# Patient Record
Sex: Male | Born: 1975 | Race: White | Hispanic: No | Smoking: Former smoker
Health system: Southern US, Community
[De-identification: ages and names within clinical notes are randomized; demographics above are authoritative.]

## PROBLEM LIST (undated history)

## (undated) DIAGNOSIS — S0285XA Fracture of orbit, unspecified, initial encounter for closed fracture: Secondary | ICD-10-CM

## (undated) HISTORY — PX: FACIAL FRACTURE SURGERY: SHX1570

---

## 1999-04-08 ENCOUNTER — Emergency Department (HOSPITAL_COMMUNITY): Admission: EM | Admit: 1999-04-08 | Discharge: 1999-04-08 | Payer: Self-pay | Admitting: Emergency Medicine

## 1999-04-11 ENCOUNTER — Emergency Department (HOSPITAL_COMMUNITY): Admission: EM | Admit: 1999-04-11 | Discharge: 1999-04-11 | Payer: Self-pay | Admitting: Emergency Medicine

## 2000-10-02 ENCOUNTER — Emergency Department (HOSPITAL_COMMUNITY): Admission: EM | Admit: 2000-10-02 | Discharge: 2000-10-02 | Payer: Self-pay | Admitting: Emergency Medicine

## 2001-12-22 ENCOUNTER — Encounter: Payer: Self-pay | Admitting: Emergency Medicine

## 2001-12-22 ENCOUNTER — Emergency Department (HOSPITAL_COMMUNITY): Admission: EM | Admit: 2001-12-22 | Discharge: 2001-12-22 | Payer: Self-pay | Admitting: Emergency Medicine

## 2004-06-21 ENCOUNTER — Emergency Department (HOSPITAL_COMMUNITY): Admission: EM | Admit: 2004-06-21 | Discharge: 2004-06-21 | Payer: Self-pay | Admitting: Emergency Medicine

## 2004-10-08 ENCOUNTER — Emergency Department (HOSPITAL_COMMUNITY): Admission: EM | Admit: 2004-10-08 | Discharge: 2004-10-08 | Payer: Self-pay | Admitting: Emergency Medicine

## 2004-10-09 ENCOUNTER — Emergency Department (HOSPITAL_COMMUNITY): Admission: EM | Admit: 2004-10-09 | Discharge: 2004-10-09 | Payer: Self-pay | Admitting: Emergency Medicine

## 2005-01-04 ENCOUNTER — Emergency Department (HOSPITAL_COMMUNITY): Admission: EM | Admit: 2005-01-04 | Discharge: 2005-01-04 | Payer: Self-pay | Admitting: Emergency Medicine

## 2005-01-06 ENCOUNTER — Emergency Department (HOSPITAL_COMMUNITY): Admission: EM | Admit: 2005-01-06 | Discharge: 2005-01-06 | Payer: Self-pay | Admitting: *Deleted

## 2005-01-09 ENCOUNTER — Emergency Department (HOSPITAL_COMMUNITY): Admission: EM | Admit: 2005-01-09 | Discharge: 2005-01-09 | Payer: Self-pay | Admitting: Family Medicine

## 2005-06-15 ENCOUNTER — Emergency Department (HOSPITAL_COMMUNITY): Admission: EM | Admit: 2005-06-15 | Discharge: 2005-06-15 | Payer: Self-pay | Admitting: Emergency Medicine

## 2005-06-21 ENCOUNTER — Emergency Department (HOSPITAL_COMMUNITY): Admission: EM | Admit: 2005-06-21 | Discharge: 2005-06-21 | Payer: Self-pay | Admitting: Emergency Medicine

## 2005-07-08 IMAGING — CR DG THORACIC SPINE 2V
3 series · 3 of 3 positions shown · non-contrast
Comparison: none

CLINICAL DATA: Fell last night with pain. 
 THORACIC SPINE - 3 VIEW:
 Three views of the thoracic spine were obtained.  The thoracic vertebrae are in normal alignment.  No acute compression deformity is seen.

[view not recorded (1 of 3)]
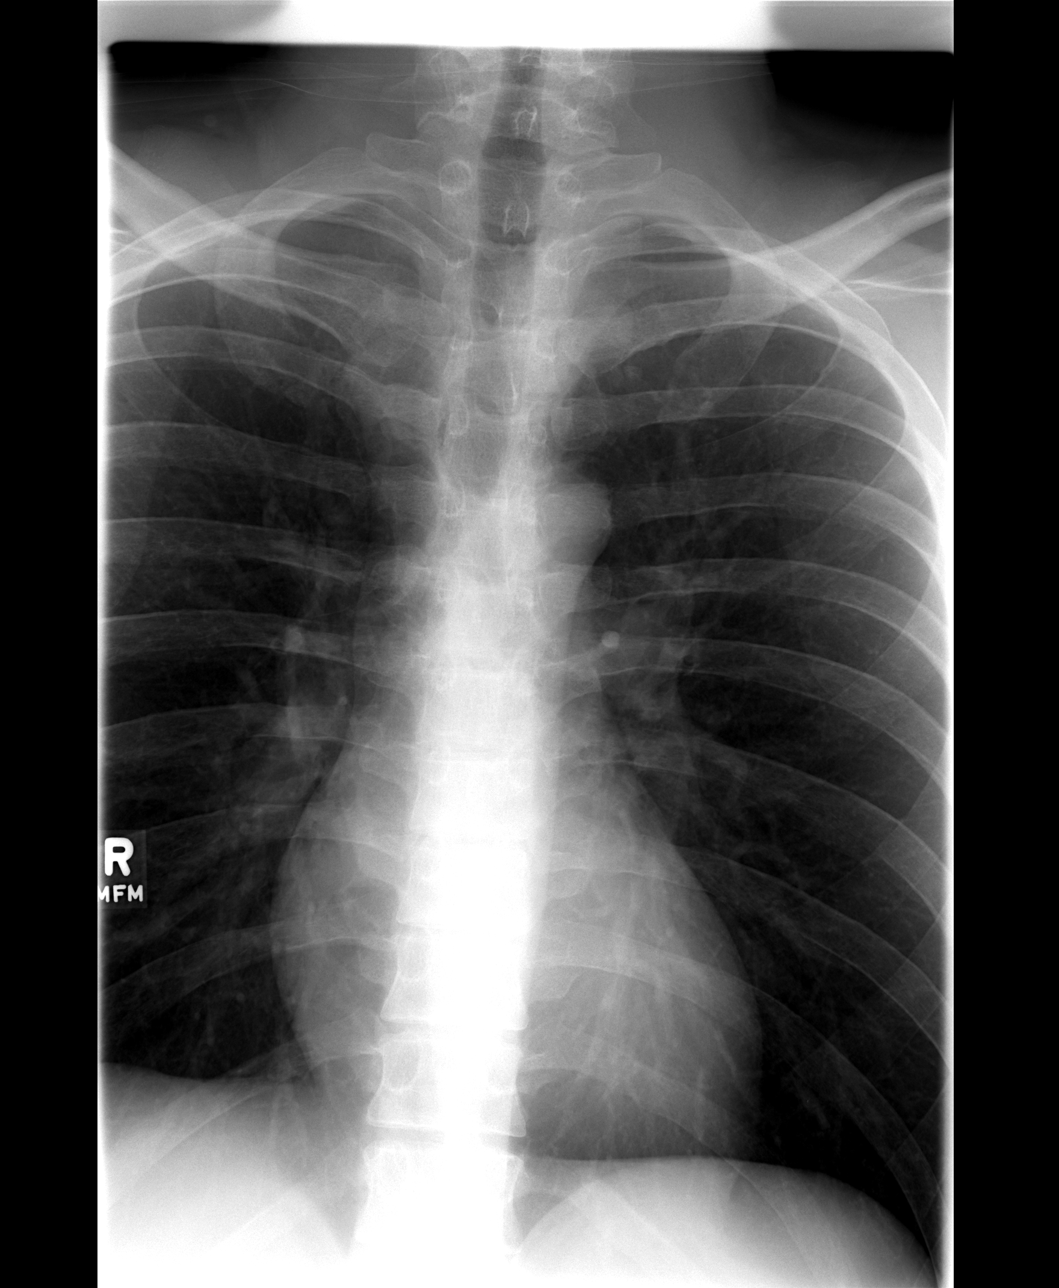

[view not recorded (2 of 3)]
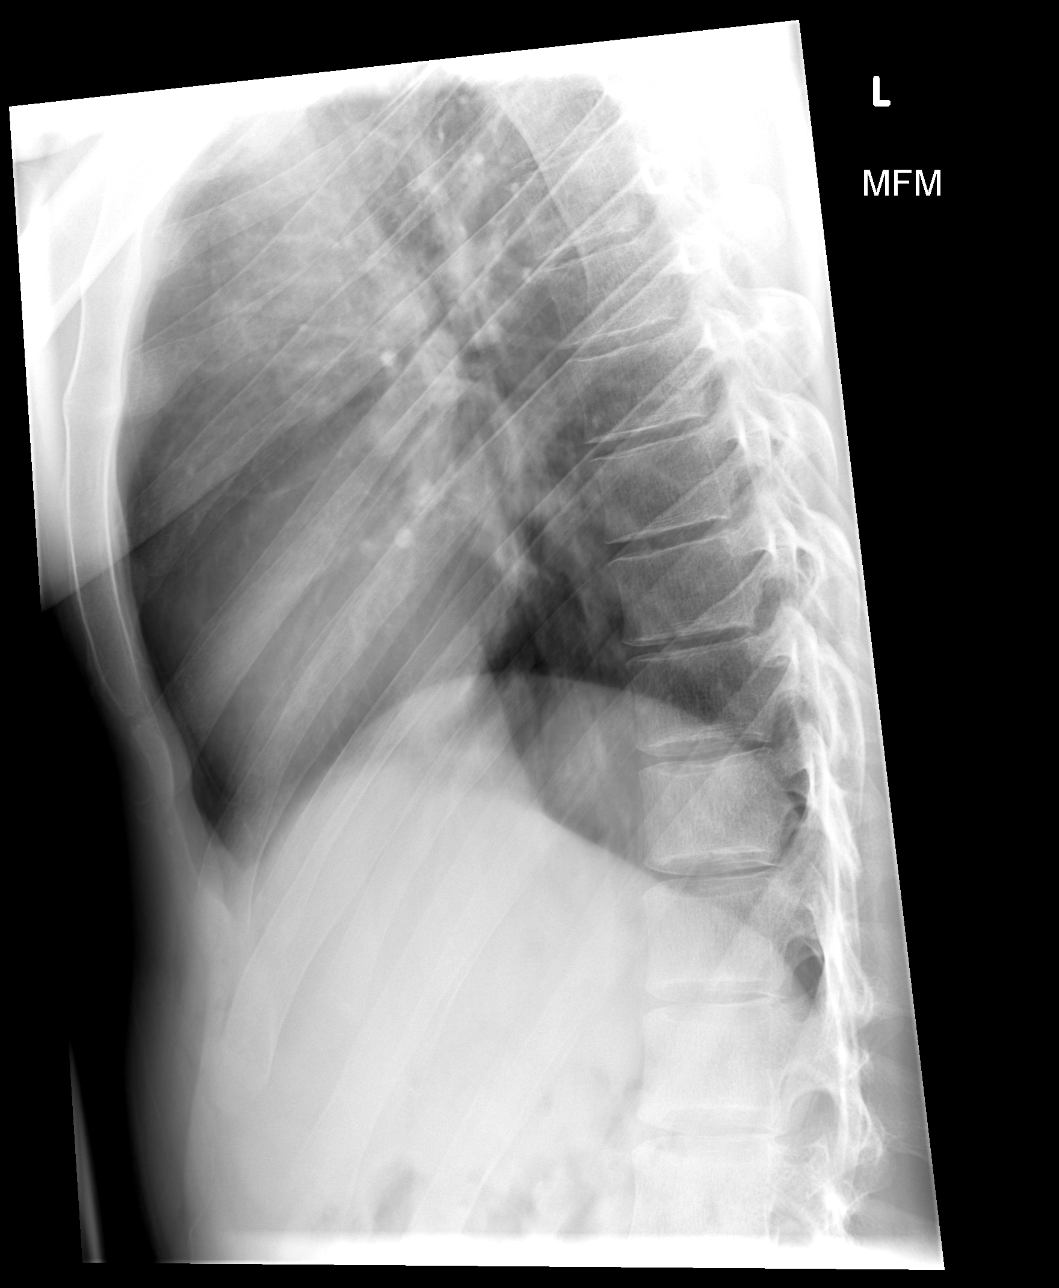

[view not recorded (3 of 3)]
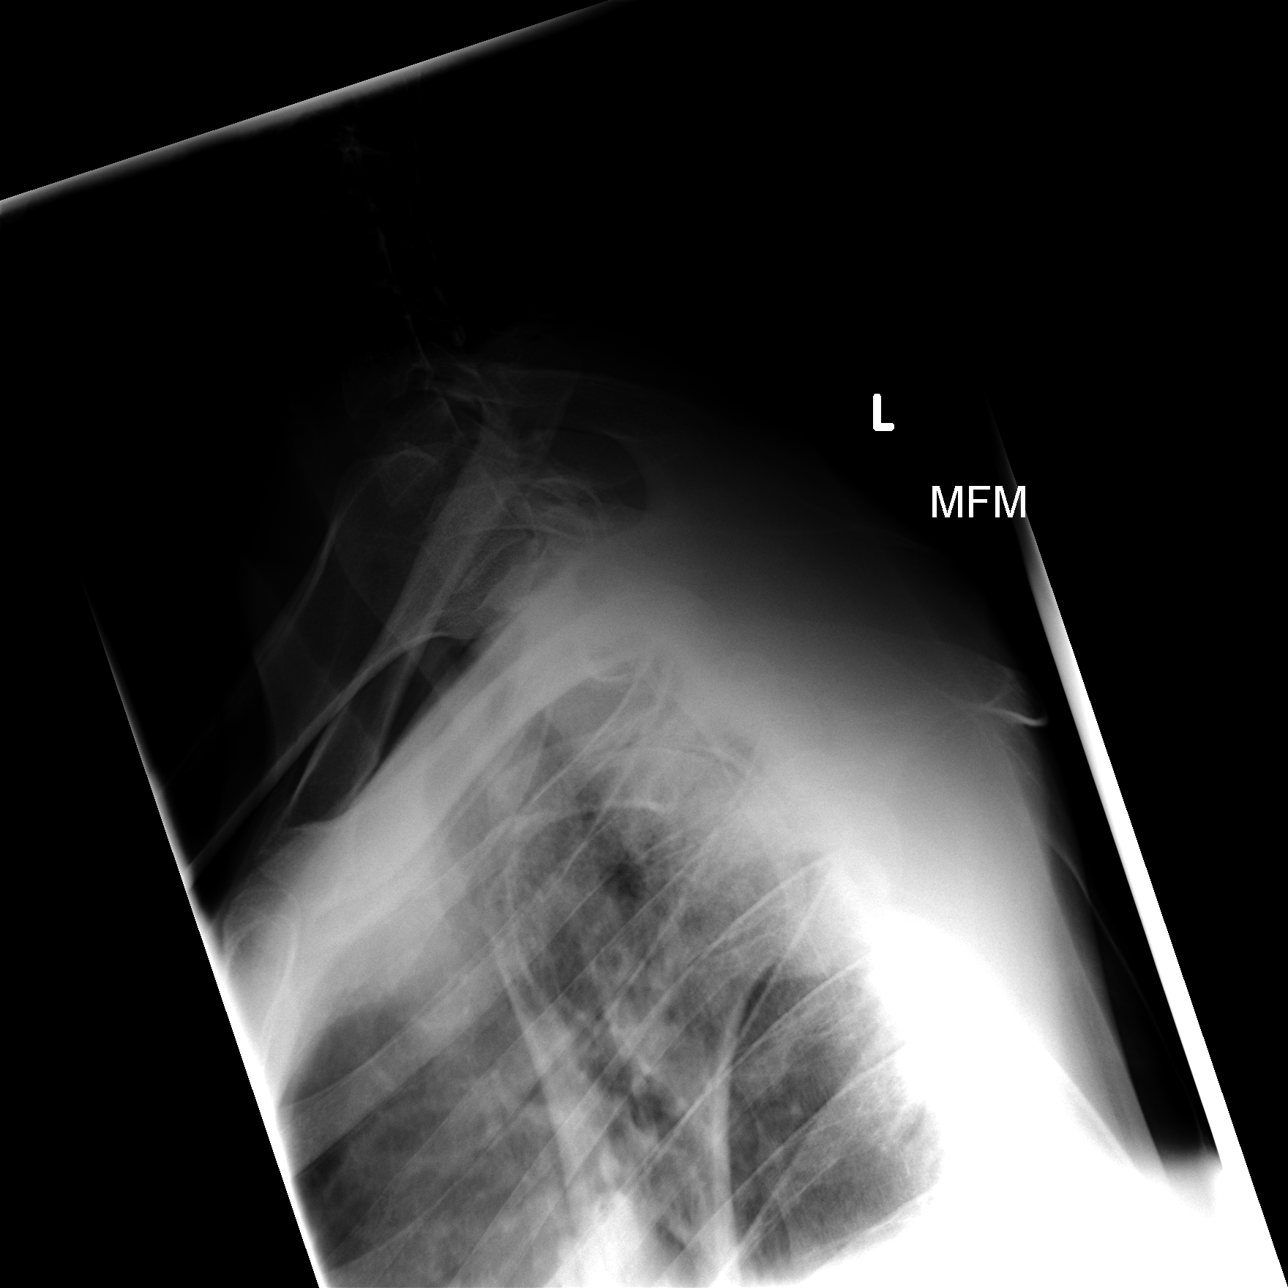

[3 of 3 positions shown; findings below may reference images not displayed]

IMPRESSION: Negative thoracic spine.

## 2005-09-29 ENCOUNTER — Emergency Department (HOSPITAL_COMMUNITY): Admission: EM | Admit: 2005-09-29 | Discharge: 2005-09-29 | Payer: Self-pay | Admitting: Emergency Medicine

## 2005-10-25 IMAGING — CR DG CHEST 2V
2 series · 2 of 2 positions shown · non-contrast
Comparison: none

HISTORY: Dyspnea, cough, fever

CHEST 2 VIEWS:
No prior exam for comparison
Normal heart size, mediastinal contours, and vascularity.
Lungs clear.
No effusion or pneumothorax.
Bones unremarkable.

[w chest pa *]
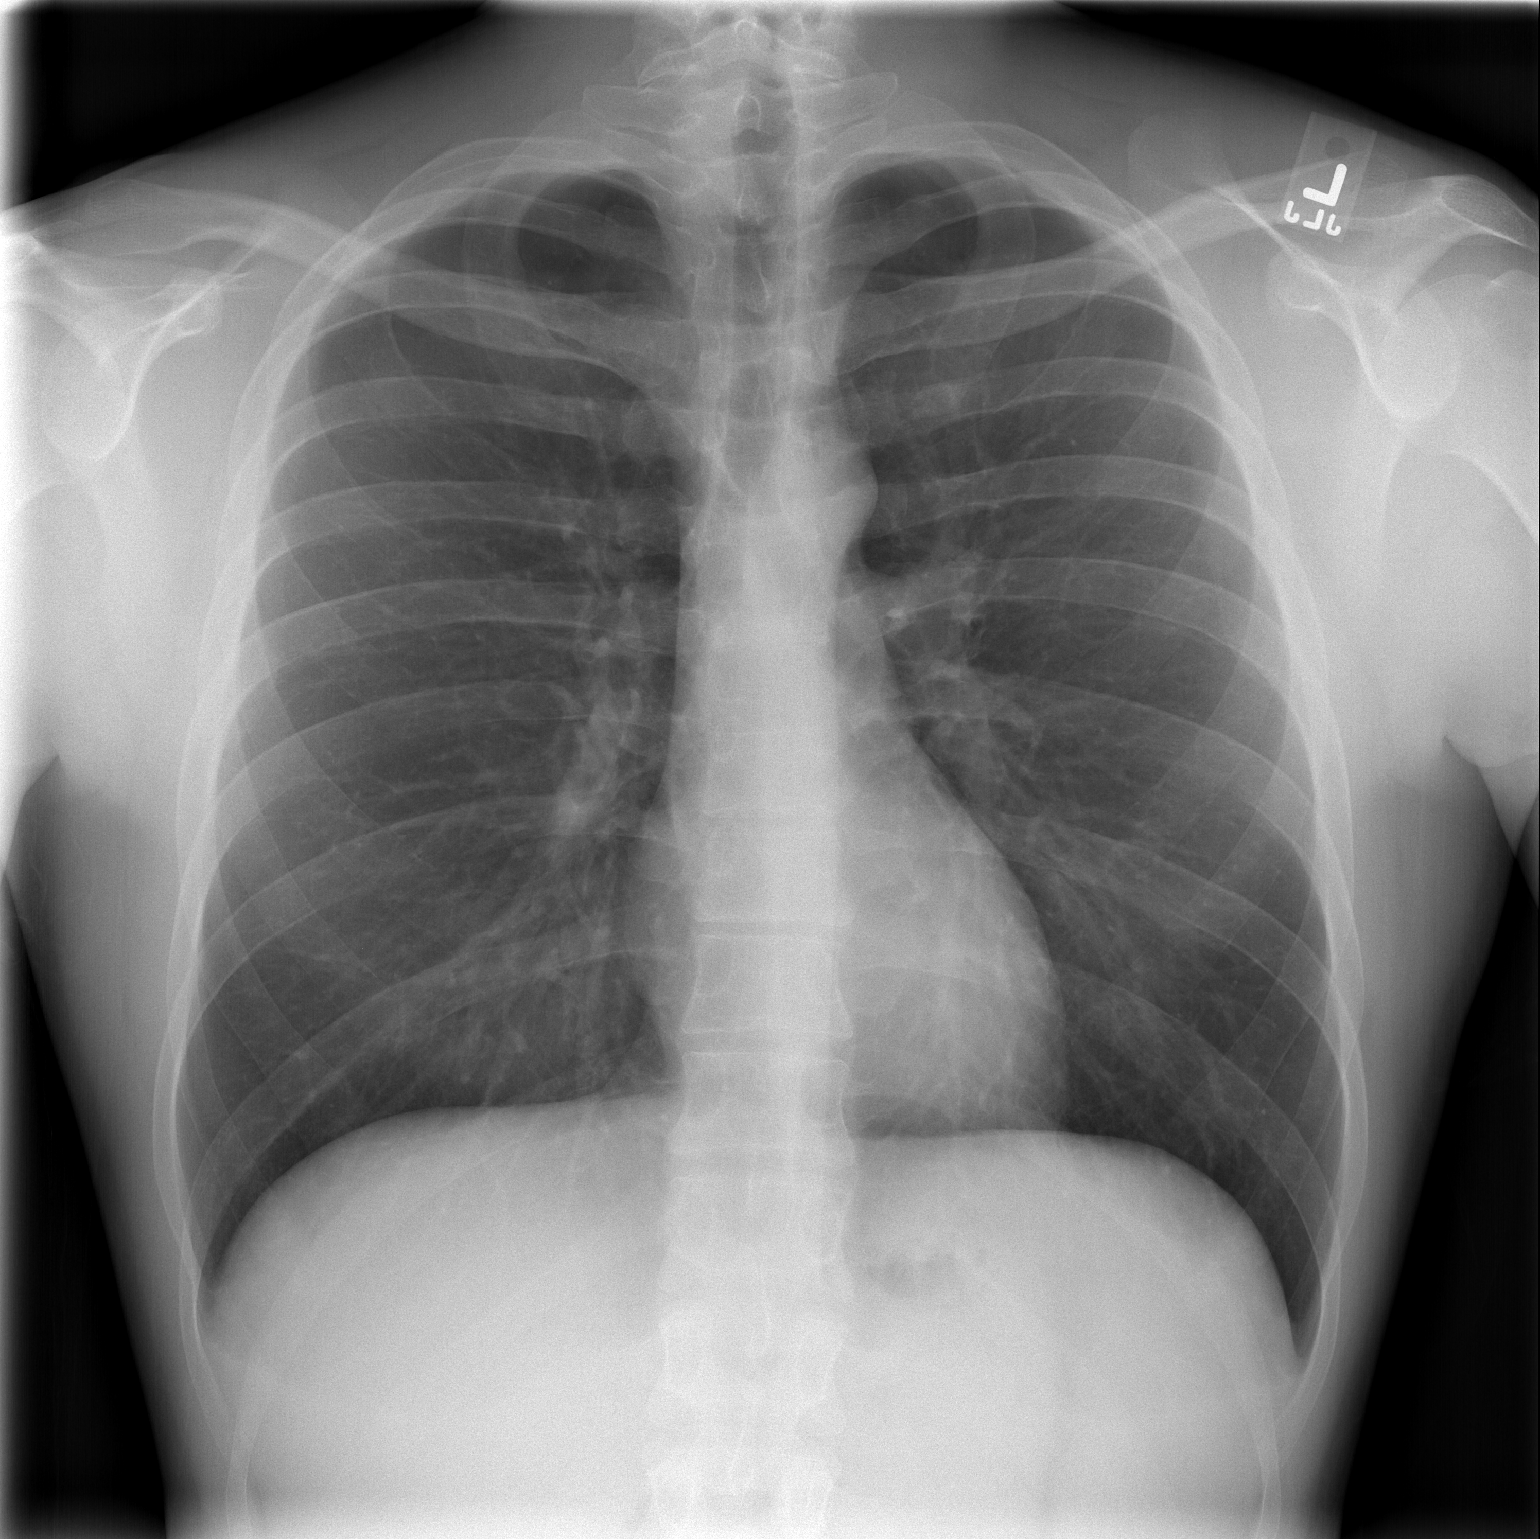

[w chest lat *]
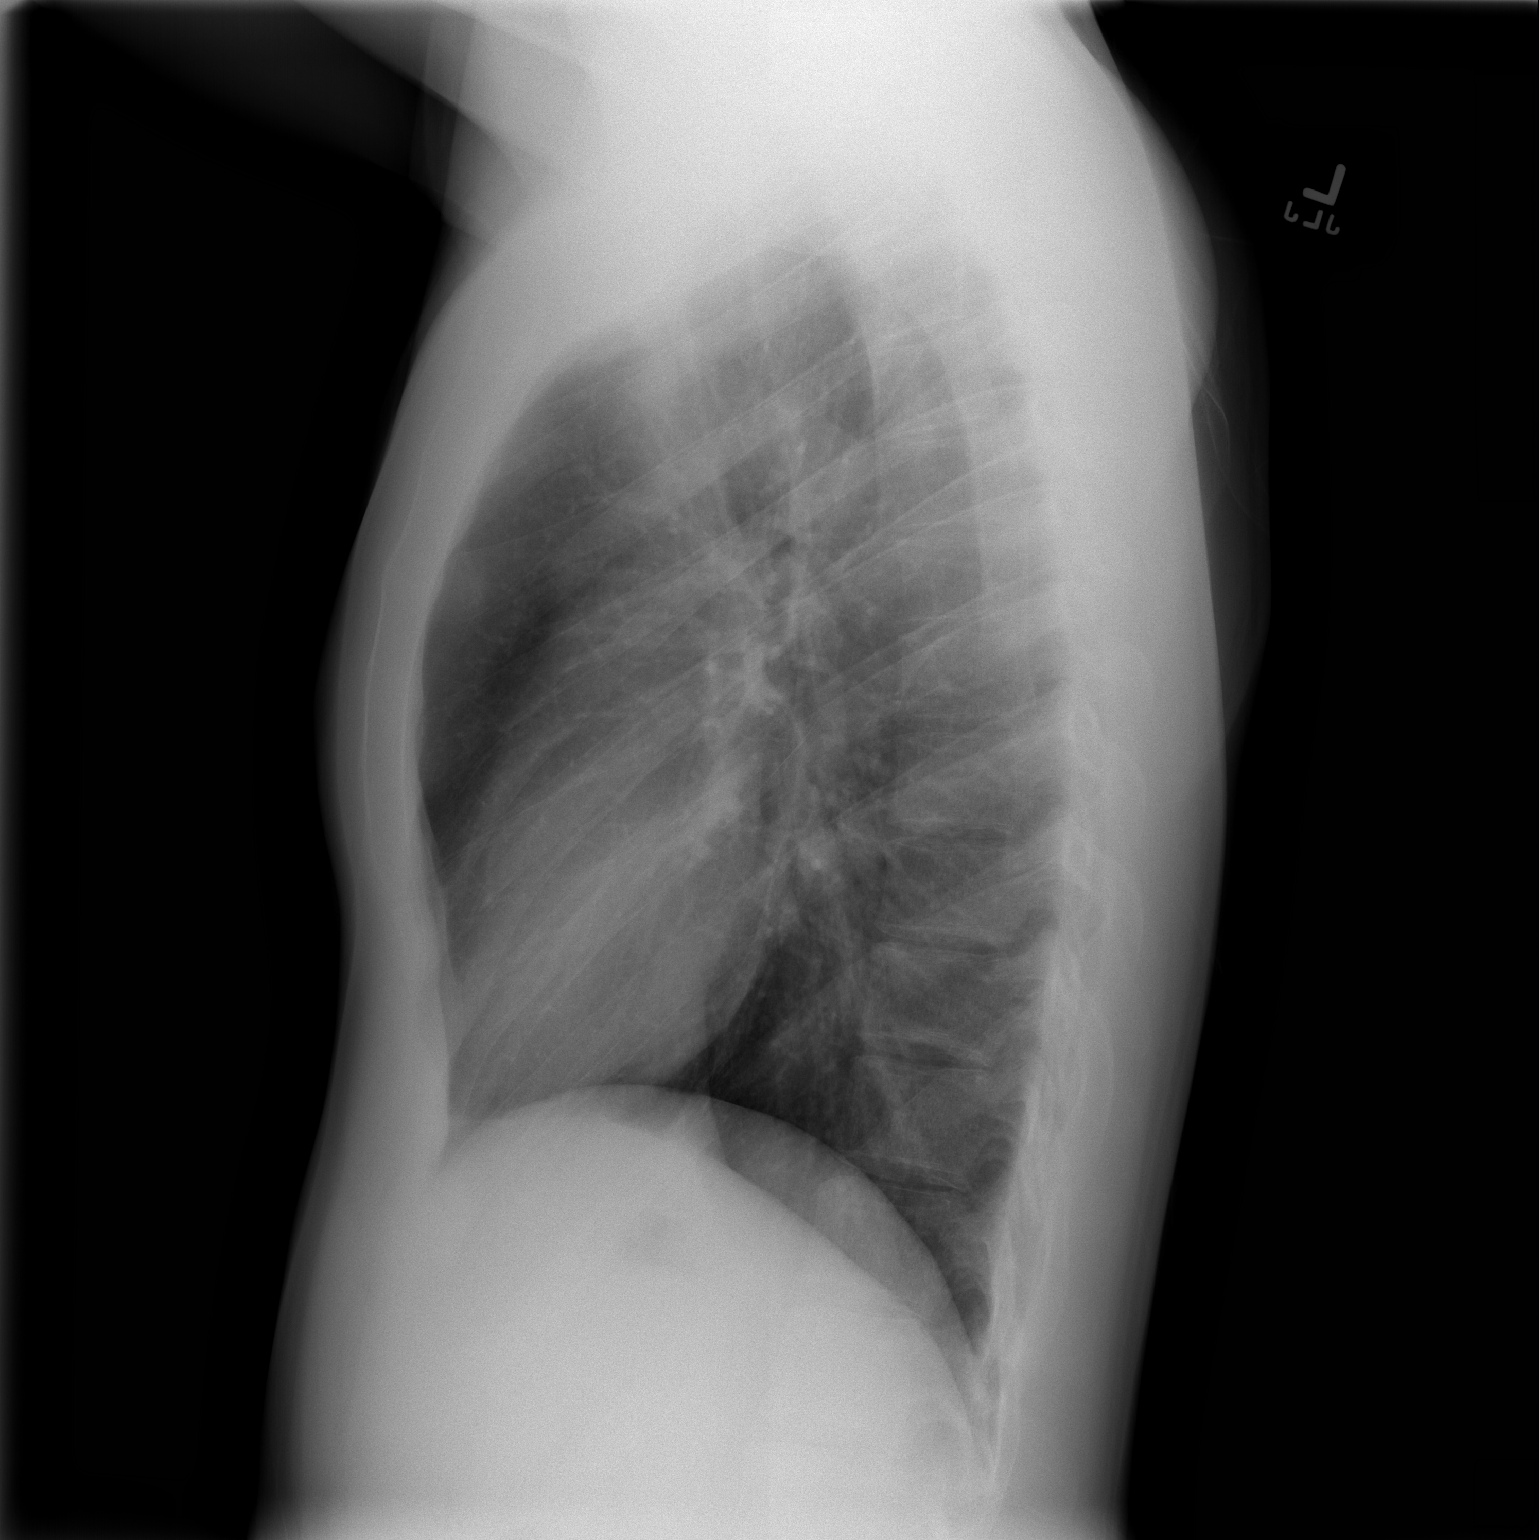

[2 of 2 positions shown; findings below may reference images not displayed]

IMPRESSION: No acute abnormalities.

## 2009-12-17 ENCOUNTER — Emergency Department (HOSPITAL_COMMUNITY): Admission: EM | Admit: 2009-12-17 | Discharge: 2009-12-17 | Payer: Self-pay | Admitting: Emergency Medicine

## 2009-12-25 ENCOUNTER — Emergency Department (HOSPITAL_COMMUNITY): Admission: EM | Admit: 2009-12-25 | Discharge: 2009-12-25 | Payer: Self-pay | Admitting: Emergency Medicine

## 2010-03-09 ENCOUNTER — Emergency Department (HOSPITAL_COMMUNITY): Admission: EM | Admit: 2010-03-09 | Discharge: 2010-03-09 | Payer: Self-pay | Admitting: Emergency Medicine

## 2010-10-26 ENCOUNTER — Emergency Department (HOSPITAL_COMMUNITY)
Admission: EM | Admit: 2010-10-26 | Discharge: 2010-10-26 | Disposition: A | Discharge status: Home / Self Care | Payer: Self-pay | Attending: Emergency Medicine | Admitting: Emergency Medicine

## 2010-10-26 DIAGNOSIS — F1910 Other psychoactive substance abuse, uncomplicated: Secondary | ICD-10-CM | POA: Insufficient documentation

## 2010-10-26 LAB — DIFFERENTIAL
Basophils Absolute: 0 10*3/uL (ref 0.0–0.1)
Basophils Relative: 0 % (ref 0–1)
Eosinophils Absolute: 0.3 10*3/uL (ref 0.0–0.7)
Eosinophils Relative: 4 % (ref 0–5)
Monocytes Absolute: 0.6 10*3/uL (ref 0.1–1.0)
Neutro Abs: 6.1 10*3/uL (ref 1.7–7.7)

## 2010-10-26 LAB — CBC
Hemoglobin: 15.7 g/dL (ref 13.0–17.0)
MCH: 31.6 pg (ref 26.0–34.0)
MCHC: 36.9 g/dL — ABNORMAL HIGH (ref 30.0–36.0)
Platelets: 180 10*3/uL (ref 150–400)
RDW: 12.6 % (ref 11.5–15.5)

## 2010-10-26 LAB — RAPID URINE DRUG SCREEN, HOSP PERFORMED
Amphetamines: NOT DETECTED
Benzodiazepines: NOT DETECTED
Opiates: NOT DETECTED
Tetrahydrocannabinol: NOT DETECTED

## 2010-10-26 LAB — ETHANOL: Alcohol, Ethyl (B): <11 mg/dL (ref 0–11)

## 2010-10-26 LAB — BASIC METABOLIC PANEL
CO2: 25 mEq/L (ref 19–32)
Chloride: 105 mEq/L (ref 96–112)
GFR calc Af Amer: >60 mL/min (ref >60)
Potassium: 3.5 mEq/L (ref 3.5–5.1)

## 2015-03-02 ENCOUNTER — Encounter (HOSPITAL_COMMUNITY): Payer: Self-pay | Admitting: Emergency Medicine

## 2015-03-02 ENCOUNTER — Emergency Department (INDEPENDENT_AMBULATORY_CARE_PROVIDER_SITE_OTHER)
Admission: EM | Admit: 2015-03-02 | Discharge: 2015-03-02 | Disposition: A | Discharge status: Home / Self Care | Payer: Self-pay | Source: Home / Self Care

## 2015-03-02 DIAGNOSIS — G43109 Migraine with aura, not intractable, without status migrainosus: Secondary | ICD-10-CM

## 2015-03-02 DIAGNOSIS — G43909 Migraine, unspecified, not intractable, without status migrainosus: Secondary | ICD-10-CM

## 2015-03-02 MED ORDER — TOPIRAMATE 25 MG PO TABS: 25.0000 mg | ORAL_TABLET | Freq: Two times a day (BID) | ORAL | Status: DC

## 2015-03-02 NOTE — ED Notes (Signed)
C/o constant HA onset 3 weeks; hx of migraines and hx of head trauma/coma in 2008 Taking ibup w/no relief.  A&O x4... No acute distress.

## 2015-03-02 NOTE — Discharge Instructions (Signed)

## 2015-03-02 NOTE — ED Provider Notes (Signed)
CSN: [id number]     Arrival date & time 03/02/15  1306 History   None    Chief Complaint  Patient presents with  . Headache   (Consider location/radiation/quality/duration/timing/severity/associated sxs/prior Treatment) HPI Comments: Patient states that he's had headache intermittently for possibly 3 weeks after striking his head while exploring the interior of a chimney. Denies weakness in his extremities, numbness, difficulty swallowing or speaking.  Patient is a 39 y.o. male presenting with headaches. The history is provided by the patient.  Headache Pain location:  R temporal (with occasional occipital headache) Quality: "sharp like an ice pick" Radiates to:  Does not radiate Severity currently:  0/10 Severity at highest:  7/10 Onset quality:  Sudden Duration:  3 weeks (hit head "not hard" while in chimney. No LOC and no N/V at that time. These headaches began after that. ) Timing:  Intermittent (usually last about 30 seconds- 1 minute and can happen multiple times per day.) Progression:  Unchanged Chronicity: has chronic headache/migraines including occular migraines since age 39 Prior  head trauma with subdural hematoma in 2008. These headaches are different. Similar to prior headaches: no   Context: not activity, not exposure to bright light, not caffeine, not coughing, not defecating, not eating and not loud noise   Relieved by:  Nothing Worsened by:  Nothing Ineffective treatments:  Acetaminophen, aspirin and NSAIDs Associated symptoms: numbness (down right arm with chronic headaches, not as much with these current headaches) and tingling (down right arm with chronic headaches, not as much with these current headaches)   Associated symptoms: no abdominal pain, no back pain, no congestion, no cough, no diarrhea, no dizziness, no ear pain, no eye pain, no fatigue, no fever, no hearing loss, no nausea, no neck pain, no neck stiffness, no photophobia, no seizures, no sinus pressure,  no sore throat and no vomiting  Weakness:      Associated symptoms comment:  "my focused attention is off. Feels like it lags behind."   History reviewed. No pertinent past medical history. History reviewed. No pertinent past surgical history. No family history on file. Social History  Substance Use Topics  . Smoking status: Current Every Day Smoker -- 1.50 packs/day    Types: Cigarettes  . Smokeless tobacco: None  . Alcohol Use: No    Review of Systems  Constitutional: Negative for fever, chills, diaphoresis and fatigue.  HENT: Positive for dental problem (grinds teeth). Negative for congestion, ear pain, facial swelling, hearing loss, nosebleeds, rhinorrhea, sinus pressure and sore throat.   Eyes: Negative for photophobia, pain, redness and visual disturbance.  Respiratory: Negative for cough, chest tightness, shortness of breath and wheezing.   Cardiovascular: Negative for chest pain.  Gastrointestinal: Negative for nausea, vomiting, abdominal pain, diarrhea and constipation.  Genitourinary: Negative for dysuria, hematuria and flank pain.  Musculoskeletal: Negative for back pain, gait problem, neck pain and neck stiffness.  Skin: Negative for pallor, rash and wound.  Neurological: Positive for numbness (down right arm with chronic headaches, not as much with these current headaches) and headaches. Negative for dizziness, seizures, syncope and light-headedness. Weakness:       Allergies  Review of patient's allergies indicates no known allergies.  Home Medications   Prior to Admission medications   Medication Sig Start Date End Date Taking? Authorizing Provider  topiramate (TOPAMAX) 25 MG tablet Take 1 tablet (25 mg total) by mouth 2 (two) times daily. May increase to 2 tabs oral 2 times per day after 1 week if headaches remain  frequent 03/02/15 03/16/15  Arnaldo Natal, MD   Meds Ordered and Administered this Visit  Medications - No data to display  BP 118/84 mmHg   Pulse 99  Temp(Src) 98.2 F (36.8 C) (Oral)  Resp 16  SpO2 99% No data found.   Physical Exam  Constitutional: He is oriented to person, place, and time. He appears well-developed and well-nourished. No distress.  HENT:  Head: Normocephalic.  Nose: Nose normal.  Mouth/Throat: Oropharynx is clear and moist.  Bilateral ears with cerumen obstructing view of TM's. No pain felt over TMJ. Patient with titanium screws in right cheek from prior head injury.   Eyes: Conjunctivae and EOM are normal. Pupils are equal, round, and reactive to light.  Neck: Normal range of motion. Neck supple.  Cardiovascular: Normal rate, regular rhythm and normal heart sounds.   Pulmonary/Chest: Effort normal and breath sounds normal.  Musculoskeletal: Normal range of motion. He exhibits no edema.  Lymphadenopathy:    He has no cervical adenopathy.  Neurological: He is alert and oriented to person, place, and time. He has normal strength. No cranial nerve deficit or sensory deficit. Coordination and gait normal.  Skin: Skin is warm and dry. No rash noted. He is not diaphoretic.  Psychiatric: He has a normal mood and affect.    ED Course  Procedures (including critical care time)  Labs Review Labs Reviewed - No data to display  Imaging Review No results found.   Visual Acuity Review  Right Eye Distance:   Left Eye Distance:   Bilateral Distance:    Right Eye Near:   Left Eye Near:    Bilateral Near:         MDM   1. Complicated migraine    Focal to characterize current headache symptomology.  Has history of occipital migraines, complicated migraines and cluster headaches. 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 Thankfully no red flag symptoms at this time. Urged patient to follow-up with neurology. He states he will explore this option based upon cost. Initially offered Fioricet. Patient is reluctant to take barbiturates. Trial of Topamax prescribed.    Arnaldo Natal, MD 03/02/15 [id number]

## 2016-04-05 ENCOUNTER — Encounter (HOSPITAL_COMMUNITY): Payer: Self-pay | Admitting: Emergency Medicine

## 2016-04-05 ENCOUNTER — Ambulatory Visit (HOSPITAL_COMMUNITY)
Admission: EM | Admit: 2016-04-05 | Discharge: 2016-04-05 | Disposition: A | Discharge status: Home / Self Care | Payer: Self-pay | Attending: Family Medicine | Admitting: Family Medicine

## 2016-04-05 DIAGNOSIS — L03031 Cellulitis of right toe: Secondary | ICD-10-CM

## 2016-04-05 DIAGNOSIS — M79674 Pain in right toe(s): Secondary | ICD-10-CM

## 2016-04-05 HISTORY — DX: Fracture of orbit, unspecified, initial encounter for closed fracture: S02.85XA

## 2016-04-05 MED ORDER — CEPHALEXIN 500 MG PO CAPS: 500.0000 mg | ORAL_CAPSULE | Freq: Four times a day (QID) | ORAL | 0 refills | Status: DC

## 2016-04-05 NOTE — ED Triage Notes (Addendum)
Patient has had a new pair of boots 2-3 weeks ago and thinks this is the cause of foot issues.  Reports right foot was the first to have issues and today left foot is starting to bother him.  Right foot has burning pain and swelling to the toes around nails.  Patient reports left foot great toe is starting to bother him today in the same way as right foot.  Bilateral pedal pulses 2 +.  Denies rash.

## 2016-04-05 NOTE — Discharge Instructions (Signed)
This appears to be a combination of pressure and infection. So far you have not formed what we discussed as a paronychia. However does appear to be an early cellulitis surrounding the toenails. Warm salt water soaks and antibiotics. Also recommend not wearing the boots anymore. Yet may also want to place cotton between your toes to ease the pressure when wearing shoes. Do not wear those boots anymore. If this is not getting better or specially this getting worse be sure to see the podiatrist as soon as possible. If there is any elevation of tissue surrounding the nail that would indicates pus collection he will need to have that drained promptly.

## 2016-04-05 NOTE — ED Provider Notes (Signed)
CSN: [id number]     Arrival date & time 04/05/16  1210 History   First MD Initiated Contact with Patient 04/05/16 1318     Chief Complaint  Patient presents with  . Foot Pain   (Consider location/radiation/quality/duration/timing/severity/associated sxs/prior Treatment) 40 year old male presents with pain in the right toes. This started approximately a month ago which coincides with the time that he purchased new boots. He states that the pain got worse over the period of 2 weeks and he thought the boots were too narrow and hurting his toes so he stopped wearing them. The past 2 weeks he has been wearing his old boots at work. He has noticed that the pain and redness is really not cleared yet and is continuing to be bothersome and painful. He denies any areas of drainage or bleeding. The pain is primarily located to the distal phalanx of all 5 toes of the right foot. Denies any known injury. Does not have his feet wet or exposed to excess moisture. Denies itching.      Past Medical History:  Diagnosis Date  . Orbit fracture (HCC)    right orbit   Past Surgical History:  Procedure Laterality Date  . FACIAL FRACTURE SURGERY     No family history on file. Social History  Substance Use Topics  . Smoking status: Current Every Day Smoker    Packs/day: 1.50    Types: Cigarettes  . Smokeless tobacco: Not on file  . Alcohol use No    Review of Systems  Constitutional: Negative.   Respiratory: Negative.   Genitourinary: Negative.   Musculoskeletal: Negative.   Skin: Negative.   Neurological: Negative.   Psychiatric/Behavioral: Negative.   All other systems reviewed and are negative.   Allergies  Patient has no known allergies.  Home Medications   Prior to Admission medications   Medication Sig Start Date End Date Taking? Authorizing Provider  cephALEXin (KEFLEX) 500 MG capsule Take 1 capsule (500 mg total) by mouth 4 (four) times daily. 04/05/16   Hayden Rasmussenavid Chery Giusto, NP  topiramate  (TOPAMAX) 25 MG tablet Take 1 tablet (25 mg total) by mouth 2 (two) times daily. May increase to 2 tabs oral 2 times per day after 1 week if headaches remain frequent 03/02/15 03/16/15  Arnaldo NatalMichael S Diamond, MD   Meds Ordered and Administered this Visit  Medications - No data to display  BP 121/82 (BP Location: Left Arm) Comment (BP Location): large cuff  Pulse 63   Temp 98 F (36.7 C) (Oral)   Resp 18   SpO2 100%  No data found.   Physical Exam  Constitutional: He is oriented to person, place, and time. He appears well-developed and well-nourished. No distress.  Eyes: EOM are normal.  Neck: Normal range of motion. Neck supple.  Cardiovascular: Normal rate.   Pulmonary/Chest: Effort normal. No respiratory distress.  Musculoskeletal: Normal range of motion. He exhibits tenderness. He exhibits no edema.  The right foot toes with erythema and minor swelling to the distal phalanges primarily around the nail edges. There is minor swelling. None of this extends to the IP joint. No fluctuance. No open areas. No draining or bleeding. No evidence of ingrown toenail.  Neurological: He is alert and oriented to person, place, and time. He exhibits normal muscle tone.  Skin: Skin is warm and dry.  Psychiatric: He has a normal mood and affect.  Nursing note and vitals reviewed.   Urgent Care Course   Clinical Course     Procedures (  including critical care time)  Labs Review Labs Reviewed - No data to display  Imaging Review No results found.   Visual Acuity Review  Right Eye Distance:   Left Eye Distance:   Bilateral Distance:    Right Eye Near:   Left Eye Near:    Bilateral Near:         MDM   1. Cellulitis of toe of right foot   2. Pain of toe of right foot    This appears to be a combination of pressure and infection. So far you have not formed what we discussed as a paronychia. However does appear to be an early cellulitis surrounding the toenails. Warm salt water  soaks and antibiotics. Also recommend not wearing the boots anymore. Yet may also want to place cotton between your toes to ease the pressure when wearing shoes. Do not wear those boots anymore. If this is not getting better or specially this getting worse be sure to see the podiatrist as soon as possible. If there is any elevation of tissue surrounding the nail that would indicates pus collection he will need to have that drained promptly. No evidence of superficial skin infection or fungal infection at this time. No evidence of joint involvement. Meds ordered this encounter  Medications  . cephALEXin (KEFLEX) 500 MG capsule    Sig: Take 1 capsule (500 mg total) by mouth 4 (four) times daily.    Dispense:  28 capsule    Refill:  0    Order Specific Question:   Supervising Provider    Answer:   Linna HoffKINDL, JAMES D [[id number]]       Hayden Rasmussenavid Vastie Douty, NP 04/05/16 1339

## 2016-04-23 ENCOUNTER — Emergency Department (HOSPITAL_COMMUNITY)
Admission: EM | Admit: 2016-04-23 | Discharge: 2016-04-23 | Disposition: A | Discharge status: Home / Self Care | Payer: Self-pay | Attending: Emergency Medicine | Admitting: Emergency Medicine

## 2016-04-23 ENCOUNTER — Encounter (HOSPITAL_COMMUNITY): Payer: Self-pay | Admitting: Emergency Medicine

## 2016-04-23 DIAGNOSIS — F17210 Nicotine dependence, cigarettes, uncomplicated: Secondary | ICD-10-CM | POA: Insufficient documentation

## 2016-04-23 DIAGNOSIS — Z7982 Long term (current) use of aspirin: Secondary | ICD-10-CM | POA: Insufficient documentation

## 2016-04-23 DIAGNOSIS — M79671 Pain in right foot: Secondary | ICD-10-CM | POA: Insufficient documentation

## 2016-04-23 DIAGNOSIS — M79674 Pain in right toe(s): Secondary | ICD-10-CM

## 2016-04-23 LAB — CBG MONITORING, ED: Glucose-Capillary: 97 mg/dL (ref 65–99)

## 2016-04-23 MED ORDER — ASPIRIN 81 MG PO CHEW: 81.0000 mg | CHEWABLE_TABLET | Freq: Every day | ORAL | 0 refills | Status: DC

## 2016-04-23 MED ORDER — PREDNISONE 20 MG PO TABS: 40.0000 mg | ORAL_TABLET | Freq: Every day | ORAL | 0 refills | Status: DC

## 2016-04-23 NOTE — ED Provider Notes (Signed)
[facility]-EMERGENCY DEPT Provider Note   CSN: [id number] Arrival date & time: 04/23/16  1630  By signing my name below, I, Gary Newman, attest that this documentation has been prepared under the direction and in the presence of Gary Fowler, PA-C . Electronically Signed: Sonum Newman, Neurosurgeon. 04/23/16. 4:59 PM.  History   Chief Complaint Chief Complaint  Patient presents with  . Foot Swelling    The history is provided by the patient. No language interpreter was used.     HPI Comments: Gary Newman is a 41 y.o. male who presents to the Emergency Department complaining of 5 weeks of persistent swelling, pain, and redness to toes on the right foot. He reports initially wearing a pair of new boots which he thinks triggered these symptoms and has since stopped wearing. He was seen at an UC 04/05/16 and was diagnosed with cellulitis and started on Keflex 500 mg which he completed. He states the affected area was unchanged with the antibiotic and states he has noticed new areas of redness and pain. He denies associated  fever.   Past Medical History:  Diagnosis Date  . Orbit fracture (HCC)    right orbit    There are no active problems to display for this patient.   Past Surgical History:  Procedure Laterality Date  . FACIAL FRACTURE SURGERY         Home Medications    Prior to Admission medications   Medication Sig Start Date End Date Taking? Authorizing Provider  aspirin 81 MG chewable tablet Chew 1 tablet (81 mg total) by mouth daily. 04/23/16   Gary Fowler, PA-C  cephALEXin (KEFLEX) 500 MG capsule Take 1 capsule (500 mg total) by mouth 4 (four) times daily. 04/05/16   Hayden Rasmussen, NP  predniSONE (DELTASONE) 20 MG tablet Take 2 tablets (40 mg total) by mouth daily. 04/23/16   Gary Fowler, PA-C  topiramate (TOPAMAX) 25 MG tablet Take 1 tablet (25 mg total) by mouth 2 (two) times daily. May increase to 2 tabs oral 2 times per day after 1 week if headaches remain frequent 03/02/15 03/16/15   Arnaldo Natal, MD    Family History History reviewed. No pertinent family history.  Social History Social History  Substance Use Topics  . Smoking status: Current Every Day Smoker    Packs/day: 1.50    Types: Cigarettes  . Smokeless tobacco: Never Used  . Alcohol use No     Allergies   Patient has no known allergies.   Review of Systems Review of Systems  A complete 10 system review of systems was obtained and all systems are negative except as noted in the HPI and PMH.   Physical Exam Updated Vital Signs BP 129/96 (BP Location: Left Arm)   Pulse 83   Temp 97.9 F (36.6 C) (Oral)   Resp 18   Ht 5\' 11"  (1.803 m)   Wt 81.6 kg   SpO2 99%   BMI 25.10 kg/m   Physical Exam  Constitutional: He is oriented to person, place, and time. He appears well-developed and well-nourished.  HENT:  Head: Normocephalic and atraumatic.  Right Ear: External ear normal.  Left Ear: External ear normal.  Eyes: Conjunctivae are normal. No scleral icterus.  Neck: No tracheal deviation present.  Cardiovascular:  Pulses:      Dorsalis pedis pulses are 2+ on the right side, and 2+ on the left side.  Pulmonary/Chest: Effort normal. No respiratory distress.  Abdominal: He exhibits no distension.  Musculoskeletal:  Normal range of motion.  Exquisite tenderness to light touch in all digits 2-5.   Neurological: He is alert and oriented to person, place, and time.  Hyperalgesia.  Normal strength. Ambulatory.   Skin: Skin is warm and dry.  Right foot: 2,3,4,5 digits with discoloration and mild blanchable erythema.  No purulence, warmth, or induration.  Psychiatric: He has a normal mood and affect. His behavior is normal.     ED Treatments / Results  DIAGNOSTIC STUDIES: Oxygen Saturation is 99% on RA, normal by my interpretation.    COORDINATION OF CARE: 4:59 PM Discussed treatment plan with pt at bedside and pt agreed to plan.   Labs (all labs ordered are listed, but only abnormal  results are displayed) Labs Reviewed  CBG MONITORING, ED    EKG  EKG Interpretation None       Radiology No results found.  Procedures Procedures (including critical care time)  Medications Ordered in ED Medications - No data to display   Initial Impression / Assessment and Plan / ED Course  I have reviewed the triage vital signs and the nursing notes.  Pertinent labs & imaging results that were available during my care of the patient were reviewed by me and considered in my medical decision making (see chart for details).  Clinical Course    Patient presents with atraumatic right second, third, fourth, and fifth toe pain. No signs of infection on exam. He has good pulses. Possible vasculitis. No indication for imaging at this time.  Plan to discharge home with aspirin and prednisone with podiatry follow-up. He has an appointment scheduled for Tuesday. Return precautions discussed. Stable for discharge.  Final Clinical Impressions(s) / ED Diagnoses   Final diagnoses:  Pain of toe of right foot    New Prescriptions Discharge Medication List as of 04/23/2016  5:25 PM    START taking these medications   Details  aspirin 81 MG chewable tablet Chew 1 tablet (81 mg total) by mouth daily., Starting Wed 04/23/2016, Print    predniSONE (DELTASONE) 20 MG tablet Take 2 tablets (40 mg total) by mouth daily., Starting Wed 04/23/2016, Print       I personally performed the services described in this documentation, which was scribed in my presence. The recorded information has been reviewed and is accurate.    Gary FowlerKayla Fareedah Mahler, PA-C 04/23/16 2003    Gary BilisKevin Campos, MD 04/24/16 [id number]

## 2016-04-23 NOTE — ED Triage Notes (Addendum)
Patient reports swelling to the toes on the right foot x1 month. Patient states at first he thought it was from a [facility] pair of boots [facility] but the swelling has continued to increase. Patient ambulatory to triage.

## 2016-04-23 NOTE — ED Notes (Signed)
PT DISCHARGED. INSTRUCTIONS AND PRESCRIPTIONS GIVEN. AAOX4. PT IN NO APPARENT DISTRESS. THE OPPORTUNITY TO ASK QUESTIONS WAS PROVIDED. 

## 2016-04-23 NOTE — Discharge Instructions (Signed)
Your symptoms could be due to vasculitis, or inflammation of your blood vessels.  Start taking Aspirin and Prednisone to decreased inflammation.  Stopping smoking would help as well.  Go to your scheduled appointment with the podiatrist.  Return to the ED for any new or worsening symptoms.

## 2016-04-29 ENCOUNTER — Ambulatory Visit (INDEPENDENT_AMBULATORY_CARE_PROVIDER_SITE_OTHER): Payer: Self-pay | Admitting: Podiatry

## 2016-04-29 ENCOUNTER — Ambulatory Visit (INDEPENDENT_AMBULATORY_CARE_PROVIDER_SITE_OTHER): Payer: Self-pay

## 2016-04-29 ENCOUNTER — Encounter: Payer: Self-pay | Admitting: Podiatry

## 2016-04-29 VITALS — BP 115/77 | HR 76 | Resp 16 | Ht 71.0 in | Wt 185.0 lb

## 2016-04-29 DIAGNOSIS — M79675 Pain in left toe(s): Secondary | ICD-10-CM

## 2016-04-29 DIAGNOSIS — M79674 Pain in right toe(s): Secondary | ICD-10-CM

## 2016-04-29 DIAGNOSIS — I739 Peripheral vascular disease, unspecified: Secondary | ICD-10-CM

## 2016-04-29 MED ORDER — NITROGLYCERIN 0.2 MG/HR TD PT24: 0.2000 mg | MEDICATED_PATCH | Freq: Every day | TRANSDERMAL | 10 refills | Status: DC

## 2016-04-29 NOTE — Progress Notes (Signed)
   Subjective:    Patient ID: Gary Newman, male    DOB: 04/21/1975, 41 y.o.   MRN: [id number]  HPI: He presents today with chief complaint of pain to the right foot. He states that all of those on the right foot been painful and have turned purple. He states that they're starting to swell pain in the ball of the foot starting to hurt. 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 He states this is been going on now for the past 6-8 weeks. He's been seen by 2 different doctors what he thought it was possibly cellulitis the other who thought it was some type of inflammatory process. He took antibiotics and prednisone. Neither of which seemed to help. He does state that this began when he first wore new boots that were exquisitely tight. He has no history of trauma. He is on the save that he is a self-pay patient and would like to try anything other than extensive blood work or testing.    Review of Systems  Constitutional: Positive for activity change.  Musculoskeletal: Positive for arthralgias.  Skin: Positive for color change.  All other systems reviewed and are negative.      Objective:   Physical Exam: Vital signs are stable alert and oriented 3. Pulses are palpable. Digits to the right foot do demonstrate slightly mottled appearance with both areas a cyanosis and hyperemia present to the toes. It does not extend past the metatarsophalangeal joints which are tender on end range of motion. No open lesions or wounds however the distal aspect of the second digit of the right foot does demonstrate what appears to be a possible sign of early breakdown. Radiographs taken today do not demonstrate any type of osseus maladies and there is no mechanical abnormalities on physical exam.        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 It does not appear to be neurological or  anatomical. Cannot rule out a seropositive arthropathy or Raynaud's phenomenon.  Plan: At this point to honor his request I simply placed him on a nitroglycerin patch. I will follow-up with him in 2 weeks at which time testing will be performed or evaluation by vascular will be appointed.

## 2016-05-13 ENCOUNTER — Encounter: Payer: Self-pay | Admitting: Podiatry

## 2016-05-13 ENCOUNTER — Ambulatory Visit (INDEPENDENT_AMBULATORY_CARE_PROVIDER_SITE_OTHER): Payer: Self-pay | Admitting: Podiatry

## 2016-05-13 DIAGNOSIS — I739 Peripheral vascular disease, unspecified: Secondary | ICD-10-CM

## 2016-05-14 NOTE — Progress Notes (Signed)
He presents today for follow-up of his discoloration and painful toes right foot and to some degree on the left foot. He states that he is cut back on his nicotine is now wearing a patch to try to quit smoking. He continues to utilize the nitroglycerin patch to the right foot.  Objective: Vital signs are stable he is alert and oriented 3. The toes are much less swollen than they were they are nontender on palpation he still has a mottling appearance of the medial aspect of the first metatarsophalangeal joint with strong palpable pulses posterior tibial and dorsalis pedis right foot. Capillary fill time is immediate. He still has good full range of motion of all of the joints affected.  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  Plan: I offered blood work which she declined I offered vascular evaluation which she declined. He states that he does not currently have a primary care provider. I highly recommended that at this point because the nitroglycerin patch seems to be helping the continue to utilize this. I also recommended that he follow-up with his primary care doctor immediately for physical exam and help with smoking cessation. At this point I feel there is nothing more that I can do for him. Follow up with me as needed.

## 2017-11-08 ENCOUNTER — Encounter (HOSPITAL_COMMUNITY): Payer: Self-pay

## 2017-11-08 ENCOUNTER — Ambulatory Visit (HOSPITAL_COMMUNITY)
Admission: EM | Admit: 2017-11-08 | Discharge: 2017-11-08 | Disposition: A | Discharge status: Home / Self Care | Payer: Self-pay | Attending: Family Medicine | Admitting: Family Medicine

## 2017-11-08 DIAGNOSIS — K0500 Acute gingivitis, plaque induced: Secondary | ICD-10-CM

## 2017-11-08 MED ORDER — CHLORHEXIDINE GLUCONATE 0.12 % MT SOLN: 15.0000 mL | Freq: Two times a day (BID) | OROMUCOSAL | 0 refills | Status: DC

## 2017-11-08 MED ORDER — PENICILLIN V POTASSIUM 500 MG PO TABS: 500.0000 mg | ORAL_TABLET | Freq: Four times a day (QID) | ORAL | 0 refills | Status: AC

## 2017-11-08 NOTE — ED Provider Notes (Signed)
[facility]    CSN: [id number] Arrival date & time: 11/08/17  1033     History   Chief Complaint No chief complaint on file.   HPI Gary Newman is a 42 y.o. male.   HPI  Patient states gums swollen off and on for a couple of months No new medicines No new foods No new toothpaste or rinse No fever chills adenopathy rash or illness Has a dental appointment in 3-4 weeks Cannot wait that long due to pain, now can only eat soft food Quit smoking 7 months ago  Past Medical History:  Diagnosis Date  . Orbit fracture (HCC)    right orbit    There are no active problems to display for this patient.   Past Surgical History:  Procedure Laterality Date  . FACIAL FRACTURE SURGERY         Home Medications    Prior to Admission medications   Medication Sig Start Date End Date Taking? Authorizing Provider  aspirin 81 MG chewable tablet Chew 1 tablet (81 mg total) by mouth daily. 04/23/16   Cheri Fowlerose, Kayla, PA-C  chlorhexidine (PERIDEX) 0.12 % solution Use as directed 15 mLs in the mouth or throat 2 (two) times daily. 11/08/17   Eustace MooreNelson, Kim Lauver Sue, MD  penicillin v potassium (VEETID) 500 MG tablet Take 1 tablet (500 mg total) by mouth 4 (four) times daily for 7 days. 11/08/17 11/15/17  Eustace MooreNelson, Davinity Fanara Sue, MD    Family History Family History  Problem Relation Age of Onset  . Diabetes Mother   . Thyroid disease Mother   . Heart attack Father     Social History Social History   Tobacco Use  . Smoking status: Former Smoker    Packs/day: 1.50    Types: Cigarettes  . Smokeless tobacco: Never Used  Substance Use Topics  . Alcohol use: No  . Drug use: No     Allergies   Patient has no known allergies.   Review of Systems Review of Systems  Constitutional: Negative for chills, fatigue and fever.  HENT: Positive for dental problem. Negative for ear pain and sore throat.   Eyes: Negative for pain and visual disturbance.  Respiratory: Negative for cough and  shortness of breath.   Cardiovascular: Negative for chest pain and palpitations.  Gastrointestinal: Negative for abdominal pain and vomiting.  Genitourinary: Negative for dysuria and hematuria.  Musculoskeletal: Negative for arthralgias and back pain.  Skin: Negative for color change and rash.  Allergic/Immunologic: Negative for environmental allergies, food allergies and immunocompromised state.  Neurological: Negative for seizures and syncope.  Hematological: Negative for adenopathy. Does not bruise/bleed easily.  All other systems reviewed and are negative.    Physical Exam Triage Vital Signs ED Triage Vitals  Enc Vitals Group     BP 11/08/17 1106 130/77     Pulse Rate 11/08/17 1106 60     Resp 11/08/17 1106 18     Temp 11/08/17 1106 98.5 F (36.9 C)     Temp src --      SpO2 11/08/17 1106 100 %     Weight --      Height --      Head Circumference --      Peak Flow --      Pain Score 11/08/17 1107 3     Pain Loc --      Pain Edu? --      Excl. in GC? --    No data found.  Updated  Vital Signs BP 130/77   Pulse 60   Temp 98.5 F (36.9 C)   Resp 18   SpO2 100%   Visual Acuity Right Eye Distance:   Left Eye Distance:   Bilateral Distance:    Right Eye Near:   Left Eye Near:    Bilateral Near:     Physical Exam  Constitutional: He appears well-developed and well-nourished. No distress.  HENT:  Head: Normocephalic and atraumatic.  Right Ear: External ear normal.  Left Ear: External ear normal.  Nose: Nose normal.  Mouth/Throat: Oropharynx is clear and moist. No oropharyngeal exudate.  Many absent teeth Gums are swollen and friable throughout  Eyes: Pupils are equal, round, and reactive to light. Conjunctivae are normal.  Neck: Normal range of motion.  Cardiovascular: Normal rate.  Pulmonary/Chest: Effort normal. No respiratory distress.  Abdominal: Soft. He exhibits no distension.  Musculoskeletal: Normal range of motion. He exhibits no edema.    Neurological: He is alert.  Skin: Skin is warm and dry.  Psychiatric: He has a normal mood and affect. His behavior is normal.     UC Treatments / Results  Labs (all labs ordered are listed, but only abnormal results are displayed) Labs Reviewed - No data to display  EKG None  Radiology No results found.  Procedures Procedures (including critical care time)  Medications Ordered in UC Medications - No data to display  Initial Impression / Assessment and Plan / UC Course  I have reviewed the triage vital signs and the nursing notes.  Pertinent labs & imaging results that were available during my care of the patient were reviewed by me and considered in my medical decision making (see chart for details).     Final Clinical Impressions(s) / UC Diagnoses   Final diagnoses:  Gingivitis, acute     Discharge Instructions     Continue with your daily brushing and flossing habits Use Peridex rinse twice a day Take the antibiotic for 7 days.  Repeat if needed Follow-up with your dentist   ED Prescriptions    Medication Sig Dispense Auth. Provider   chlorhexidine (PERIDEX) 0.12 % solution Use as directed 15 mLs in the mouth or throat 2 (two) times daily. 473 mL Eustace Moore, MD   penicillin v potassium (VEETID) 500 MG tablet Take 1 tablet (500 mg total) by mouth 4 (four) times daily for 7 days. 28 tablet Eustace Moore, MD     Controlled Substance Prescriptions [place] Controlled Substance Registry consulted? Not Applicable   Eustace Moore, MD 11/08/17 1215

## 2017-11-08 NOTE — Discharge Instructions (Signed)
Continue with your daily brushing and flossing habits Use Peridex rinse twice a day Take the antibiotic for 7 days.  Repeat if needed Follow-up with your dentist

## 2017-11-08 NOTE — ED Triage Notes (Signed)
Reports with swollen gums x 3 weeks.

## 2018-07-06 ENCOUNTER — Ambulatory Visit (HOSPITAL_COMMUNITY)
Admission: EM | Admit: 2018-07-06 | Discharge: 2018-07-06 | Disposition: A | Discharge status: Home / Self Care | Payer: Self-pay | Attending: Physician Assistant | Admitting: Physician Assistant

## 2018-07-06 ENCOUNTER — Other Ambulatory Visit: Payer: Self-pay

## 2018-07-06 ENCOUNTER — Encounter (HOSPITAL_COMMUNITY): Payer: Self-pay

## 2018-07-06 DIAGNOSIS — L723 Sebaceous cyst: Secondary | ICD-10-CM

## 2018-07-06 DIAGNOSIS — L0211 Cutaneous abscess of neck: Secondary | ICD-10-CM

## 2018-07-06 MED ORDER — SULFAMETHOXAZOLE-TRIMETHOPRIM 800-160 MG PO TABS
1.0000 | ORAL_TABLET | Freq: Two times a day (BID) | ORAL | 0 refills | Status: AC
Start: 2018-07-06 — End: 2018-07-13

## 2018-07-06 MED ORDER — LIDOCAINE HCL 2 % IJ SOLN
INTRAMUSCULAR | Status: AC
  Filled 2018-07-06: qty 20

## 2018-07-06 NOTE — ED Provider Notes (Signed)
[facility]    CSN: [id number] Arrival date & time: 07/06/18  1643     History   Chief Complaint Chief Complaint  Patient presents with  . Abscess    HPI Gary Newman is a 43 y.o. male.   The history is provided by the patient. No language interpreter was used.  Abscess  Location:  Head/neck Size:  2 Abscess quality: painful and redness   Abscess quality: not draining  Induration:  cm.   Red streaking: no   Progression:  Worsening Chronicity:  New Relieved by:  Nothing Worsened by:  Nothing Ineffective treatments:  None tried Associated symptoms: no nausea   Pt reports he has a lump on the back of his neck  Past Medical History:  Diagnosis Date  . Orbit fracture    right orbit    There are no active problems to display for this patient.   Past Surgical History:  Procedure Laterality Date  . FACIAL FRACTURE SURGERY         Home Medications    Prior to Admission medications   Medication Sig Start Date End Date Taking? Authorizing Provider  aspirin 81 MG chewable tablet Chew 1 tablet (81 mg total) by mouth daily. 04/23/16   Cheri Fowler, PA-C  chlorhexidine (PERIDEX) 0.12 % solution Use as directed 15 mLs in the mouth or throat 2 (two) times daily. 11/08/17   Eustace Moore, MD  sulfamethoxazole-trimethoprim (BACTRIM DS,SEPTRA DS) 800-160 MG tablet Take 1 tablet by mouth 2 (two) times daily for 7 days. 07/06/18 07/13/18  Elson Areas, PA-C    Family History Family History  Problem Relation Age of Onset  . Diabetes Mother   . Thyroid disease Mother   . Heart attack Father     Social History Social History   Tobacco Use  . Smoking status: Former Smoker    Packs/day: 1.50    Types: Cigarettes  . Smokeless tobacco: Never Used  Substance Use Topics  . Alcohol use: No  . Drug use: No     Allergies   Patient has no known allergies.   Review of Systems Review of Systems  Gastrointestinal: Negative for nausea.  All other  systems reviewed and are negative.    Physical Exam Triage Vital Signs ED Triage Vitals  Enc Vitals Group     BP 07/06/18 1709 (!) 136/95     Pulse Rate 07/06/18 1709 (!) 56     Resp 07/06/18 1709 18     Temp 07/06/18 1709 98.5 F (36.9 C)     Temp Source 07/06/18 1709 Oral     SpO2 07/06/18 1709 100 %     Weight --      Height --      Head Circumference --      Peak Flow --      Pain Score 07/06/18 1713 5     Pain Loc --      Pain Edu? --      Excl. in GC? --    No data found.  Updated Vital Signs BP (!) 136/95 (BP Location: Right Arm)   Pulse (!) 56   Temp 98.5 F (36.9 C) (Oral)   Resp 18   SpO2 100%   Visual Acuity Right Eye Distance:   Left Eye Distance:   Bilateral Distance:    Right Eye Near:   Left Eye Near:    Bilateral Near:     Physical Exam Vitals signs and nursing note reviewed.  Cardiovascular:     Rate and Rhythm: Normal rate.  Pulmonary:     Effort: Pulmonary effort is normal.  Musculoskeletal: Normal range of motion.  Skin:    Comments: 2cm mass back of neck.  Erythematous   Neurological:     General: No focal deficit present.     Mental Status: He is alert.  Psychiatric:        Mood and Affect: Mood normal.      UC Treatments / Results  Labs (all labs ordered are listed, but only abnormal results are displayed) Labs Reviewed - No data to display  EKG None  Radiology No results found.  Procedures Incision and Drainage Date/Time: 07/06/2018 6:54 PM Performed by: Elson Areas, PA-C Authorized by: Elson Areas, PA-C   Consent:    Consent obtained:  Verbal   Consent given by:  Patient   Alternatives discussed:  No treatment Location:    Type:  Abscess   Size:  2   Location:  Neck Pre-procedure details:    Skin preparation:  Betadine Anesthesia (see MAR for exact dosages):    Anesthesia method:  None Procedure type:    Complexity:  Simple Procedure details:    Needle aspiration: no     Incision types:   Single straight   Scalpel blade:  11   Wound management:  Probed and deloculated   Drainage:  Purulent   Wound treatment:  Wound left open   Packing materials:  None Post-procedure details:    Patient tolerance of procedure:  Tolerated well, no immediate complications   (including critical care time)  Medications Ordered in UC Medications - No data to display  Initial Impression / Assessment and Plan / UC Course  I have reviewed the triage vital signs and the nursing notes.  Pertinent labs & imaging results that were available during my care of the patient were reviewed by me and considered in my medical decision making (see chart for details).    Pt counseled on sebaceous cyst.  Pt advised on wound care   Final Clinical Impressions(s) / UC Diagnoses   Final diagnoses:  Sebaceous cyst     Discharge Instructions     Return if any problems.   ED Prescriptions    Medication Sig Dispense Auth. Provider   sulfamethoxazole-trimethoprim (BACTRIM DS,SEPTRA DS) 800-160 MG tablet Take 1 tablet by mouth 2 (two) times daily for 7 days. 14 tablet Elson Areas, [place]     Controlled Substance Prescriptions [place] Controlled Substance Registry consulted? Not Applicable   Elson Areas, [place] 07/06/18 6606

## 2018-07-06 NOTE — Discharge Instructions (Signed)
Return if any problems.

## 2018-07-06 NOTE — ED Triage Notes (Signed)
Pt presents with abscess on back of neck area.

## 2020-11-06 ENCOUNTER — Encounter (HOSPITAL_COMMUNITY): Payer: Self-pay

## 2020-11-06 ENCOUNTER — Other Ambulatory Visit: Payer: Self-pay

## 2020-11-06 ENCOUNTER — Ambulatory Visit (HOSPITAL_COMMUNITY)
Admission: EM | Admit: 2020-11-06 | Discharge: 2020-11-06 | Disposition: A | Discharge status: Home / Self Care | Payer: Self-pay | Attending: Emergency Medicine | Admitting: Emergency Medicine

## 2020-11-06 DIAGNOSIS — R200 Anesthesia of skin: Secondary | ICD-10-CM

## 2020-11-06 DIAGNOSIS — G43001 Migraine without aura, not intractable, with status migrainosus: Secondary | ICD-10-CM

## 2020-11-06 MED ORDER — PREDNISONE 10 MG (21) PO TBPK: ORAL_TABLET | Freq: Every day | ORAL | 0 refills | Status: DC

## 2020-11-06 NOTE — ED Provider Notes (Signed)
[facility]    CSN: [id number] Arrival date & time: 11/06/20  0857      History   Chief Complaint Chief Complaint  Patient presents with   Migraine   Blurred Vision   Numbness    hands    HPI Gary Newman is a 45 y.o. male.   Pt has chronic migraines since 2008. State that he had an injury then had to place an orbital plate in his face. He is unsure but thinks it maybe more going on. Has tingling sensation to bil hands, and headache for 2 weeks now. Takes tylenol and motrin and it helps. He has aura took meds and they are not as bad now 2/10. No emesis no nausea. Alert x4, walked into room. No sob, no chest pain, no facial droop, no incont. Has chronic lower back problems but does not believe this is a cause.    Past Medical History:  Diagnosis Date   Orbit fracture (HCC)    right orbit    There are no problems to display for this patient.   Past Surgical History:  Procedure Laterality Date   FACIAL FRACTURE SURGERY         Home Medications    Prior to Admission medications   Medication Sig Start Date End Date Taking? Authorizing Provider  predniSONE (STERAPRED UNI-PAK 21 TAB) 10 MG (21) TBPK tablet Take by mouth daily. Take 6 tabs by mouth daily  for 2 days, then 5 tabs for 2 days, then 4 tabs for 2 days, then 3 tabs for 2 days, 2 tabs for 2 days, then 1 tab by mouth daily for 2 days 11/06/20  Yes Coralyn Mark, NP  aspirin 81 MG chewable tablet Chew 1 tablet (81 mg total) by mouth daily. 04/23/16   Cheri Fowler, PA-C  chlorhexidine (PERIDEX) 0.12 % solution Use as directed 15 mLs in the mouth or throat 2 (two) times daily. 11/08/17   Eustace Moore, MD    Family History Family History  Problem Relation Age of Onset   Diabetes Mother    Thyroid disease Mother    Heart attack Father     Social History Social History   Tobacco Use   Smoking status: Former    Packs/day: 1.50    Types: Cigarettes   Smokeless tobacco: Never  Substance  Use Topics   Alcohol use: No   Drug use: No     Allergies   Patient has no known allergies.   Review of Systems Review of Systems  Constitutional: Negative.   HENT:  Positive for postnasal drip and tinnitus. Negative for congestion, ear discharge, ear pain and sore throat.   Eyes: Negative.   Respiratory: Negative.    Cardiovascular: Negative.   Gastrointestinal: Negative.   Neurological:  Positive for numbness and headaches. Negative for dizziness, seizures, syncope, facial asymmetry, speech difficulty, weakness and light-headedness.       Intermit aura, headache, blurred vision in past not now.     Physical Exam Triage Vital Signs ED Triage Vitals  Enc Vitals Group     BP 11/06/20 1010 119/85     Pulse Rate 11/06/20 1010 68     Resp 11/06/20 1010 20     Temp 11/06/20 1010 98.4 F (36.9 C)     Temp Source 11/06/20 1010 Oral     SpO2 11/06/20 1010 100 %     Weight --      Height --  Head Circumference --      Peak Flow --      Pain Score 11/06/20 1008 3     Pain Loc --      Pain Edu? --      Excl. in GC? --    No data found.  Updated Vital Signs BP 119/85 (BP Location: Right Arm)   Pulse 68   Temp 98.4 F (36.9 C) (Oral)   Resp 20   SpO2 100%   Visual Acuity Right Eye Distance:   Left Eye Distance:   Bilateral Distance:    Right Eye Near:   Left Eye Near:    Bilateral Near:     Physical Exam Constitutional:      Appearance: Normal appearance. He is not ill-appearing.  HENT:     Right Ear: Tympanic membrane normal.     Left Ear: Tympanic membrane normal.     Nose: Nose normal.     Mouth/Throat:     Mouth: Mucous membranes are moist.  Eyes:     Pupils: Pupils are equal, round, and reactive to light.  Cardiovascular:     Rate and Rhythm: Normal rate.  Pulmonary:     Effort: Pulmonary effort is normal.  Abdominal:     General: Abdomen is flat.  Musculoskeletal:        General: Normal range of motion.     Cervical back: Normal range of  motion.  Skin:    General: Skin is warm.     Capillary Refill: Capillary refill takes less than 2 seconds.  Neurological:     General: No focal deficit present.     Mental Status: He is alert.     GCS: GCS eye subscore is 4. GCS verbal subscore is 5. GCS motor subscore is 6.     Cranial Nerves: Cranial nerves are intact.     Motor: Motor function is intact.     Coordination: Coordination is intact.     Gait: Gait is intact.     Deep Tendon Reflexes: Reflexes are normal and symmetric.     UC Treatments / Results  Labs (all labs ordered are listed, but only abnormal results are displayed) Labs Reviewed - No data to display  EKG   Radiology No results found.  Procedures Procedures (including critical care time)  Medications Ordered in UC Medications - No data to display  Initial Impression / Assessment and Plan / UC Course  I have reviewed the triage vital signs and the nursing notes.  Pertinent labs & imaging results that were available during my care of the patient were reviewed by me and considered in my medical decision making (see chart for details).     Discussed that he would need to go to ER for further test or ct scan. Pt does not have funds due to he is self pay.  Will try a round of steriods  Pt does not believe that it is cause from back pain.  Did not want any meds today while here for headache states that he will take motrin  Will give referral to see neuro  Final Clinical Impressions(s) / UC Diagnoses   Final diagnoses:  Migraine without aura and with status migrainosus, not intractable  Numbness     Discharge Instructions      Will need to see neuro for further test or ct scan  Go to ER if becomes worse  Take meds as needed  Stay hydrated  Can take Claritin daily to help  with any fluid build up to ears or sinus area      ED Prescriptions     Medication Sig Dispense Auth. Provider   predniSONE (STERAPRED UNI-PAK 21 TAB) 10 MG (21) TBPK  tablet Take by mouth daily. Take 6 tabs by mouth daily  for 2 days, then 5 tabs for 2 days, then 4 tabs for 2 days, then 3 tabs for 2 days, 2 tabs for 2 days, then 1 tab by mouth daily for 2 days 42 tablet Coralyn Mark, NP      PDMP not reviewed this encounter.   Coralyn Mark, NP 11/06/20 1130

## 2020-11-06 NOTE — ED Triage Notes (Addendum)
Pt presents with c/o a migraine x 2 weeks.   States he has a hx of migraines. States he has started to have numbness in hands and blurry vision 2 weeks ago.  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   States he does not take medication for migraine.   Denies chest pain.   If something is prescribed patient prefers paper prescription.

## 2020-11-06 NOTE — Discharge Instructions (Addendum)
Will need to see neuro for further test or ct scan  Go to ER if becomes worse  Take meds as needed  Stay hydrated  Can take Claritin daily to help with any fluid build up to ears or sinus area

## 2020-11-12 ENCOUNTER — Encounter: Payer: Self-pay | Admitting: Neurology

## 2021-01-30 NOTE — Progress Notes (Deleted)
NEUROLOGY CONSULTATION NOTE  Gary Newman MRN: [id number] DOB: April 29, 1975  Referring provider: Coralyn Mark, NP (Urgent Care referral) Primary care provider: No PCP  Reason for consult:  migraines, numbness  Assessment/Plan:   ***   Subjective:  Gary Newman is a 45 year old male who presents for migraines and numbness.  History supplemented by referring provider's note.  Onset:  2008, following a head injury resulting in right orbital fracture requiring a plate Location:  *** Quality:  *** Intensity:  ***.  *** denies new headache, thunderclap headache or severe headache that wakes *** from sleep. Aura:  *** Prodrome:  *** Postdrome:  *** Associated symptoms:  ***.  *** denies associated unilateral numbness or weakness. Duration:  *** Frequency:  *** Frequency of abortive medication: *** Triggers:  *** Relieving factors:  *** Activity:  ***  Due to two weeks of daily headache, he went to Urgent Care in July where he was prescribed a prednisone taper.  ***  He actually had a CT head on 06/22/2005 for 2 week history of headaches, which was personally reviewed and revealed no acute intracranial abnormality.  Current NSAIDS/analgesics:  Tylenol, Motrin Current triptans:  none Current ergotamine:  none Current anti-emetic:  none Current muscle relaxants:  none Current Antihypertensive medications:  none Current Antidepressant medications:  none Current Anticonvulsant medications:  none Current anti-CGRP:  none Current Vitamins/Herbal/Supplements:  none Current Antihistamines/Decongestants:  none Other therapy:  none Hormone/birth control:  none  Past NSAIDS/analgesics:  *** Past abortive triptans:  none Past abortive ergotamine:  none Past muscle relaxants:  none Past anti-emetic:  none Past antihypertensive medications:  none Past antidepressant medications:  none Past anticonvulsant medications:  none Past anti-CGRP:  none Past  vitamins/Herbal/Supplements:  none Past antihistamines/decongestants:  none Other past therapies:  prednisone taper  Caffeine:  *** Alcohol:  *** Smoker:  *** Diet:  *** Exercise:  *** Depression:  ***; Anxiety:  *** Other pain:  *** Sleep hygiene:  *** Family history of headache:  ***      PAST MEDICAL HISTORY: Past Medical History:  Diagnosis Date   Orbit fracture (HCC)    right orbit    PAST SURGICAL HISTORY: Past Surgical History:  Procedure Laterality Date   FACIAL FRACTURE SURGERY      MEDICATIONS: Current Outpatient Medications on File Prior to Visit  Medication Sig Dispense Refill   aspirin 81 MG chewable tablet Chew 1 tablet (81 mg total) by mouth daily. 14 tablet 0   chlorhexidine (PERIDEX) 0.12 % solution Use as directed 15 mLs in the mouth or throat 2 (two) times daily. 473 mL 0   predniSONE (STERAPRED UNI-PAK 21 TAB) 10 MG (21) TBPK tablet Take by mouth daily. Take 6 tabs by mouth daily  for 2 days, then 5 tabs for 2 days, then 4 tabs for 2 days, then 3 tabs for 2 days, 2 tabs for 2 days, then 1 tab by mouth daily for 2 days 42 tablet 0   No current facility-administered medications on file prior to visit.    ALLERGIES: No Known Allergies  FAMILY HISTORY: Family History  Problem Relation Age of Onset   Diabetes Mother    Thyroid disease Mother    Heart attack Father     Objective:  *** General: No acute distress.  Patient appears well-groomed.   Head:  Normocephalic/atraumatic Eyes:  fundi examined but not visualized Neck: supple, no paraspinal tenderness, full range of motion Back: No paraspinal tenderness Heart: regular  rate and rhythm Lungs: Clear to auscultation bilaterally. Vascular: No carotid bruits. Neurological Exam: Mental status: alert and oriented to person, place, and time, recent and remote memory intact, fund of knowledge intact, attention and concentration intact, speech fluent and not dysarthric, language intact. Cranial  nerves: CN I: not tested CN II: pupils equal, round and reactive to light, visual fields intact CN III, IV, VI:  full range of motion, no nystagmus, no ptosis CN V: facial sensation intact. CN VII: upper and lower face symmetric CN VIII: hearing intact CN IX, X: gag intact, uvula midline CN XI: sternocleidomastoid and trapezius muscles intact CN XII: tongue midline Bulk & Tone: normal, no fasciculations. Motor:  muscle strength 5/5 throughout Sensation:  Pinprick, temperature and vibratory sensation intact. Deep Tendon Reflexes:  2+ throughout,  toes downgoing.   Finger to nose testing:  Without dysmetria.   Heel to shin:  Without dysmetria.   Gait:  Normal station and stride.  Romberg negative.    Thank you for allowing me to take part in the care of this patient.  Shon Millet, DO  CC: ***

## 2021-01-31 ENCOUNTER — Ambulatory Visit: Payer: Medicaid Other | Admitting: Neurology

## 2021-12-17 ENCOUNTER — Ambulatory Visit (INDEPENDENT_AMBULATORY_CARE_PROVIDER_SITE_OTHER): Payer: No Typology Code available for payment source | Admitting: Orthopaedic Surgery

## 2021-12-17 ENCOUNTER — Encounter: Payer: Self-pay | Admitting: Orthopaedic Surgery

## 2021-12-17 ENCOUNTER — Ambulatory Visit (INDEPENDENT_AMBULATORY_CARE_PROVIDER_SITE_OTHER): Payer: No Typology Code available for payment source

## 2021-12-17 DIAGNOSIS — M542 Cervicalgia: Secondary | ICD-10-CM

## 2021-12-17 MED ORDER — DICLOFENAC SODIUM 75 MG PO TBEC: 75.0000 mg | DELAYED_RELEASE_TABLET | Freq: Two times a day (BID) | ORAL | 2 refills | Status: DC | PRN

## 2021-12-17 MED ORDER — METHOCARBAMOL 500 MG PO TABS: 500.0000 mg | ORAL_TABLET | Freq: Two times a day (BID) | ORAL | 2 refills | Status: DC | PRN

## 2021-12-17 NOTE — Progress Notes (Signed)
Office Visit Note   Patient: Gary Newman           Date of Birth: 05/19/1975           MRN: [id number] Visit Date: 12/17/2021              Requested by: No referring provider defined for this encounter. PCP: Patient, No Pcp Per   Assessment & Plan: Visit Diagnoses:  1. Neck pain     Plan: Impression is left-sided cervical strain.  X-rays are unremarkable.  Because the patient does not tolerate steroids, I would like to start him on an NSAID as well as a muscle relaxer and have sent this in.  Would also like to start him in physical therapy and a hardcopy prescription has been filled out.  He will remain out of work for another week and then they return desk work only for 3 weeks.  Follow-up in 4 weeks for recheck.  Call with concerns or questions in the meantime.  Follow-Up Instructions: Return in about 4 weeks (around 01/14/2022).   Orders:  Orders Placed This Encounter  Procedures   XR Cervical Spine 2 or 3 views   Meds ordered this encounter  Medications   diclofenac (VOLTAREN) 75 MG EC tablet    Sig: Take 1 tablet (75 mg total) by mouth 2 (two) times daily as needed.    Dispense:  60 tablet    Refill:  2   methocarbamol (ROBAXIN) 500 MG tablet    Sig: Take 1 tablet (500 mg total) by mouth 2 (two) times daily as needed for muscle spasms.    Dispense:  20 tablet    Refill:  2      Procedures: No procedures performed   Clinical Data: No additional findings.   Subjective: Chief Complaint  Patient presents with   Neck - Pain    Workers Comp    HPI patient is a pleasant 46 year old gentleman who comes in today following a work comp injury.  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  He was seen soon after an urgent care setting where he was prescribed a steroid and muscle relaxer.  He did not take the steroid as he has a history of  heart palpitations with having taken this in the past.  He did not take the muscle relaxer either but admits to what sounds like an IM injection which did not help.  His pain has mildly improved.  Symptoms are worse with lying down as well as rotation and flexion of the neck.  He denies any weakness or paresthesias to either upper extremity.  He is otherwise not been taking any medication for the pain.  He does have a history of migraines for which she gets about once per month but notes he has had 2 since this pain began.  He has tried using heat without relief.  No previous injury to the neck.  Review of Systems as detailed in HPI.  All others reviewed and are negative.   Objective: Vital Signs: There were no vitals taken for this visit.  Physical Exam well-developed well-nourished gentleman in no acute distress.  Alert and oriented x3.  Ortho Exam cervical spine exam shows no spinous or paraspinous tenderness.  He has slight pain with cervical spine flexion and left-sided rotation.  No focal weakness to either upper extremity.  He is neurovascular tact distally.  Specialty Comments:  No specialty comments available.  Imaging: XR Cervical Spine 2 or 3 views  Result Date: 12/17/2021 Slight straightening of the cervical spine.  Otherwise, no acute or structural abnormalities    PMFS History: There are no problems to display for this patient.  Past Medical History:  Diagnosis Date   Orbit fracture ([facility]CC)    right orbit    Family History  Problem Relation Age of Onset   Diabetes Mother    Thyroid disease Mother    Heart attack Father     Past Surgical History:  Procedure Laterality Date   FACIAL FRACTURE SURGERY     Social History   Occupational History   Not on file  Tobacco Use   Smoking status: Former    Packs/day: 1.50    Types: Cigarettes   Smokeless tobacco: Never  Substance and Sexual Activity   Alcohol use: No   Drug use: No   Sexual activity: Not on file

## 2021-12-17 NOTE — Addendum Note (Signed)
Addended by: Wendi Maya on: 12/17/2021 02:55 PM   Modules accepted: Orders

## 2022-01-14 ENCOUNTER — Encounter: Payer: Self-pay | Admitting: Orthopaedic Surgery

## 2022-01-14 ENCOUNTER — Ambulatory Visit (INDEPENDENT_AMBULATORY_CARE_PROVIDER_SITE_OTHER): Payer: No Typology Code available for payment source | Admitting: Orthopaedic Surgery

## 2022-01-14 DIAGNOSIS — M542 Cervicalgia: Secondary | ICD-10-CM | POA: Diagnosis not present

## 2022-01-14 NOTE — Progress Notes (Signed)
   Office Visit Note   Patient: Gary Newman           Date of Birth: 05/26/1975           MRN: [id number] Visit Date: 01/14/2022              Requested by: No referring provider defined for this encounter. PCP: Patient, No Pcp Per   Assessment & Plan: Visit Diagnoses:  1. Cervicalgia     Plan: Impression is nearly resolved cervical strain.  At this point, the patient has noticed great improvement with physical therapy.  He will continue this for another 2 weeks to help with his right-sided rotation.  We will continue light duty for another 2 weeks and then may return to full duty without restrictions.  Follow-up with [place] as needed.  Call with concerns or questions.  Follow-Up Instructions: Return if symptoms worsen or fail to improve.   Orders:  No orders of the defined types were placed in this encounter.  No orders of the defined types were placed in this encounter.     Procedures: No procedures performed   Clinical Data: No additional findings.   Subjective: Chief Complaint  Patient presents with   Neck - Pain    HPI patient is a pleasant 46 year old gentleman who comes in today for follow-up of his cervical strain.  This is filed under work [employer].  He has been on NSAIDs and has been to physical therapy which has significantly helped.  He still notes slight discomfort with rotating his neck up into the right.  He has been working light duty without any issues.     Objective: Vital Signs: There were no vitals taken for this visit.    Ortho Exam cervical spine exam is unremarkable  Specialty Comments:  No specialty comments available.  Imaging: No new imaging   PMFS History: There are no problems to display for this patient.  Past Medical History:  Diagnosis Date   Orbit fracture ([place])    right orbit    Family History  Problem Relation Age of Onset   Diabetes Mother    Thyroid disease Mother    Heart attack Father     Past Surgical History:   Procedure Laterality Date   FACIAL FRACTURE SURGERY     Social History   Occupational History   Not on file  Tobacco Use   Smoking status: Former    Packs/day: 1.50    Types: Cigarettes   Smokeless tobacco: Never  Substance and Sexual Activity   Alcohol use: No   Drug use: No   Sexual activity: Not on file

## 2022-02-11 ENCOUNTER — Encounter: Payer: Self-pay | Admitting: Orthopaedic Surgery

## 2022-02-11 ENCOUNTER — Ambulatory Visit (INDEPENDENT_AMBULATORY_CARE_PROVIDER_SITE_OTHER): Payer: No Typology Code available for payment source | Admitting: Orthopaedic Surgery

## 2022-02-11 DIAGNOSIS — M542 Cervicalgia: Secondary | ICD-10-CM | POA: Diagnosis not present

## 2022-02-11 NOTE — Progress Notes (Signed)
   Office Visit Note   Patient: Gary Newman           Date of Birth: 17-Feb-1976           MRN: [id number] Visit Date: 02/11/2022              Requested by: No referring provider defined for this encounter. PCP: Patient, No Pcp Per   Assessment & Plan: Visit Diagnoses:  1. Cervicalgia     Plan: At this point Gary Newman has made a near full recovery.  He has been released back to work without any restrictions.  We will plan on seeing him back as needed.  He is at [facility] and rating is 0%.  Follow-Up Instructions: No follow-ups on file.   Orders:  No orders of the defined types were placed in this encounter.  No orders of the defined types were placed in this encounter.     Procedures: No procedures performed   Clinical Data: No additional findings.   Subjective: Chief Complaint  Patient presents with   Neck - Follow-up    HPI Gary Newman returns today for follow-up from his neck pain.  Is a workers comp injury.  He has been back to work for 2 weeks now.  He has been working a normal shift.  He has been discharged from physical therapy after meeting his goals.  Review of Systems  Constitutional: Negative.   All other systems reviewed and are negative.    Objective: Vital Signs: There were no vitals taken for this visit.  Physical Exam Vitals and nursing note reviewed.  Constitutional:      Appearance: He is well-developed.  HENT:     Head: Normocephalic and atraumatic.  Eyes:     Pupils: Pupils are equal, round, and reactive to light.  Pulmonary:     Effort: Pulmonary effort is normal.  Abdominal:     Palpations: Abdomen is soft.  Musculoskeletal:        General: Normal range of motion.     Cervical back: Neck supple.  Skin:    General: Skin is warm.  Neurological:     Mental Status: He is alert and oriented to person, place, and time.  Psychiatric:        Behavior: Behavior normal.        Thought Content: Thought content normal.        Judgment:  Judgment normal.     Ortho Exam Examination of the cervical spine shows no tenderness.  He has near full range of motion. Specialty Comments:  No specialty comments available.  Imaging: No results found.   PMFS History: There are no problems to display for this patient.  Past Medical History:  Diagnosis Date   Orbit fracture ([place])    right orbit    Family History  Problem Relation Age of Onset   Diabetes Mother    Thyroid disease Mother    Heart attack Father     Past Surgical History:  Procedure Laterality Date   FACIAL FRACTURE SURGERY     Social History   Occupational History   Not on file  Tobacco Use   Smoking status: Former    Packs/day: 1.50    Types: Cigarettes   Smokeless tobacco: Never  Substance and Sexual Activity   Alcohol use: No   Drug use: No   Sexual activity: Not on file

## 2022-11-04 ENCOUNTER — Ambulatory Visit (HOSPITAL_COMMUNITY)
Admission: RE | Admit: 2022-11-04 | Discharge: 2022-11-04 | Disposition: A | Discharge status: Home / Self Care | Payer: Medicaid Other | Source: Ambulatory Visit | Attending: Family Medicine | Admitting: Family Medicine

## 2022-11-04 ENCOUNTER — Encounter (HOSPITAL_COMMUNITY): Payer: Self-pay

## 2022-11-04 VITALS — BP 124/83 | HR 61 | Temp 98.1°F | Resp 16 | Ht 71.0 in | Wt 187.0 lb

## 2022-11-04 DIAGNOSIS — H6123 Impacted cerumen, bilateral: Secondary | ICD-10-CM | POA: Diagnosis not present

## 2022-11-04 DIAGNOSIS — H60393 Other infective otitis externa, bilateral: Secondary | ICD-10-CM

## 2022-11-04 MED ORDER — OFLOXACIN 0.3 % OT SOLN: 5.0000 [drp] | Freq: Two times a day (BID) | OTIC | 0 refills | Status: AC

## 2022-11-04 NOTE — ED Triage Notes (Signed)
Patient here today with c/o bilat ear fullness and decreased hearing X 2 months. Patient did a telehealth visit in May and was given an antibiotic but did not help.

## 2022-11-04 NOTE — ED Provider Notes (Signed)
[facility]    CSN: [id number] Arrival date & time: 11/04/22  1039      History   Chief Complaint Chief Complaint  Patient presents with   Ear Fullness    Muffled sound in both ears for over 3 months. Can't hear hardly at all. Tried amd antibiotics thinking it was infection but didn't work. Sometimes my ears "leak" - Entered by patient    HPI Gary Newman is a 47 y.o. male.    Ear Fullness  Here for bilateral trouble hearing and fullness in his ears.  Is been going on for 2 or 3 months.  In May he did a telehealth visit and was prescribed an antibiotic.  It of course did not help.  No fever or cough or congestion these last few days.  He has some fullness in his ears and occasional pain in either ear.  Occasionally they will leak a little clear fluid.    Past Medical History:  Diagnosis Date   Orbit fracture (HCC)    right orbit    There are no problems to display for this patient.   Past Surgical History:  Procedure Laterality Date   FACIAL FRACTURE SURGERY         Home Medications    Prior to Admission medications   Medication Sig Start Date End Date Taking? Authorizing Provider  levocetirizine (XYZAL) 5 MG tablet Take 5 mg by mouth daily.   Yes [provider]  Magnesium Glycinate 100 MG CAPS Take 200 mg by mouth at bedtime.   Yes [provider]  ofloxacin (FLOXIN) 0.3 % OTIC solution Place 5 drops into both ears 2 (two) times daily for 7 days. 11/04/22 11/11/22 Yes Zenia Resides, MD    Family History Family History  Problem Relation Age of Onset   Diabetes Mother    Thyroid disease Mother    Heart attack Father     Social History Social History   Tobacco Use   Smoking status: Former    Current packs/day: 1.50    Types: Cigarettes   Smokeless tobacco: Never  Substance Use Topics   Alcohol use: No   Drug use: No     Allergies   Patient has no known allergies.   Review of Systems Review of  Systems   Physical Exam Triage Vital Signs ED Triage Vitals  Encounter Vitals Group     BP 11/04/22 1105 124/83     Systolic BP Percentile --      Diastolic BP Percentile --      Pulse Rate 11/04/22 1105 61     Resp 11/04/22 1105 16     Temp 11/04/22 1105 98.1 F (36.7 C)     Temp Source 11/04/22 1105 Oral     SpO2 11/04/22 1105 98 %     Weight 11/04/22 1105 187 lb (84.8 kg)     Height 11/04/22 1105 5\' 11"  (1.803 m)     Head Circumference --      Peak Flow --      Pain Score 11/04/22 1103 1     Pain Loc --      Pain Education --      Exclude from Growth Chart --    No data found.  Updated Vital Signs BP 124/83 (BP Location: Left Arm)   Pulse 61   Temp 98.1 F (36.7 C) (Oral)   Resp 16   Ht 5\' 11"  (1.803 m)   Wt 84.8 kg  SpO2 98%   BMI 26.08 kg/m   Visual Acuity Right Eye Distance:   Left Eye Distance:   Bilateral Distance:    Right Eye Near:   Left Eye Near:    Bilateral Near:     Physical Exam Vitals reviewed.  Constitutional:      General: He is not in acute distress.    Appearance: He is not ill-appearing, toxic-appearing or diaphoretic.  HENT:     Ears:     Comments: Bilaterally tympanic membranes are obscured by cerumen. Skin:    Coloration: Skin is not pale.  Neurological:     General: No focal deficit present.     Mental Status: He is alert and oriented to person, place, and time.  Psychiatric:        Behavior: Behavior normal.      UC Treatments / Results  Labs (all labs ordered are listed, but only abnormal results are displayed) Labs Reviewed - No data to display  EKG   Radiology No results found.  Procedures Procedures (including critical care time)  Medications Ordered in UC Medications - No data to display  Initial Impression / Assessment and Plan / UC Course  I have reviewed the triage vital signs and the nursing notes.  Pertinent labs & imaging results that were available during my care of the patient were reviewed  by me and considered in my medical decision making (see chart for details).        Staff lavaged both ears with good results.  After lavage, there is some erythema of the canal walls on both sides with a little bit of adherent white discharge. Floxin drops are sent in for external otitis.  We discussed measures to prevent the cerumen impaction in the future.  Final Clinical Impressions(s) / UC Diagnoses   Final diagnoses:  Bilateral impacted cerumen  Infective otitis externa of both ears     Discharge Instructions      -Floxin eardrops-5 drops in the affected ear 2 times daily for 7 days.       ED Prescriptions     Medication Sig Dispense Auth. Provider   ofloxacin (FLOXIN) 0.3 % OTIC solution Place 5 drops into both ears 2 (two) times daily for 7 days. 5 mL Zenia Resides, MD      PDMP not reviewed this encounter.   Zenia Resides, MD 11/04/22 1201

## 2022-11-04 NOTE — Discharge Instructions (Signed)
-  Floxin eardrops-5 drops in the affected ear 2 times daily for 7 days.
